# Patient Record
Sex: Female | Born: 1970 | Hispanic: Yes | Marital: Married | Smoking: Never smoker
Health system: Southern US, Community
[De-identification: ages and names within clinical notes are randomized; demographics above are authoritative.]

## PROBLEM LIST (undated history)

## (undated) HISTORY — PX: TUBAL LIGATION: SHX77

## (undated) HISTORY — PX: BREAST CYST ASPIRATION: SHX578

---

## 2008-07-20 ENCOUNTER — Ambulatory Visit: Payer: Self-pay

## 2012-02-11 ENCOUNTER — Emergency Department: Payer: Self-pay | Admitting: Emergency Medicine

## 2012-02-11 LAB — BASIC METABOLIC PANEL
BUN: 13 mg/dL (ref 7–18)
Chloride: 106 mmol/L (ref 98–107)
Creatinine: 0.44 mg/dL — ABNORMAL LOW (ref 0.60–1.30)
EGFR (Non-African Amer.): >60
Potassium: 3.7 mmol/L (ref 3.5–5.1)

## 2012-04-21 ENCOUNTER — Encounter: Payer: Self-pay | Admitting: Orthopedic Surgery

## 2012-05-16 ENCOUNTER — Encounter: Payer: Self-pay | Admitting: Orthopedic Surgery

## 2012-10-21 ENCOUNTER — Ambulatory Visit: Payer: Self-pay

## 2012-10-27 ENCOUNTER — Ambulatory Visit: Payer: Self-pay | Admitting: General Surgery

## 2012-11-17 ENCOUNTER — Ambulatory Visit (INDEPENDENT_AMBULATORY_CARE_PROVIDER_SITE_OTHER): Payer: PRIVATE HEALTH INSURANCE | Admitting: General Surgery

## 2012-11-17 ENCOUNTER — Encounter: Payer: Self-pay | Admitting: General Surgery

## 2012-11-17 VITALS — BP 110/70 | HR 70 | Resp 12 | Ht 61.5 in | Wt 176.0 lb

## 2012-11-17 DIAGNOSIS — D1720 Benign lipomatous neoplasm of skin and subcutaneous tissue of unspecified limb: Secondary | ICD-10-CM

## 2012-11-17 DIAGNOSIS — D2360 Other benign neoplasm of skin of unspecified upper limb, including shoulder: Secondary | ICD-10-CM

## 2012-11-17 NOTE — Progress Notes (Signed)
Patient ID: Holly Paul, female   DOB: Nov 24, 1970, 42 y.o.   MRN: [id number]  Chief Complaint  Patient presents with  . Other    right breast mass    HPI Holly Paul is a 42 y.o. female who presents for a breast evaluation. The most recent mammogram and ultrasound was done on 10-21-12.  Patient does not perform regular self breast checks and does not get regular mammograms done. Her previous mammogram was 5 years ago. States she found a knot about one year ago in right axillary area. Denies change in size. Tender to touch. Denies any redness or drainage from the area.   HPI  History reviewed. No pertinent past medical history.  Past Surgical History  Procedure Laterality Date  . Tubal ligation      Family History  Problem Relation Age of Onset  . Diabetes Sister     Social History History  Substance Use Topics  . Smoking status: Never Smoker   . Smokeless tobacco: Never Used  . Alcohol Use: No    No Known Allergies  No current outpatient prescriptions on file.   No current facility-administered medications for this visit.    Review of Systems Review of Systems  Constitutional: Negative.   Respiratory: Negative.   Cardiovascular: Negative.     Blood pressure 110/70, pulse 70, resp. rate 12, height 5' 1.5" (1.562 m), weight 176 lb (79.833 kg), last menstrual period 10/25/2012.  Physical Exam Physical Exam  Constitutional: She is oriented to person, place, and time. She appears well-developed and well-nourished.  Eyes: Conjunctivae are normal. No scleral icterus.  Neck: Neck supple.  Cardiovascular: Normal rate and regular rhythm.   Pulmonary/Chest: Effort normal and breath sounds normal. Right breast exhibits no inverted nipple, no mass, no nipple discharge, no skin change and no tenderness. Left breast exhibits no inverted nipple, no mass, no nipple discharge, no skin change and no tenderness.  Lymphadenopathy:    She has no cervical  adenopathy.    She has no axillary adenopathy.  Right axillary is more prominent and suggestive of fullness in this area  Neurological: She is alert and oriented to person, place, and time.  Skin: Skin is warm and dry.    Data Reviewed Mammogram and ultrasound reviewed. Showed a couple of 3 mm cysts--11 ocl and in ax tail.   Assessment    Exam shows fullness to right axillary area possible small lipoma about 2 cm in size. No lymphadenopathies noted.     Plan    Continue to monitor area of concern. No need for excision at this time.  Interpreter present throughout.       SANKAR,SEEPLAPUTHUR G 11/17/2012, 11:17 AM

## 2012-11-17 NOTE — Patient Instructions (Addendum)
Continue to monitor area of concern. No need for excision at this time. If it gets larger or is more painful then we can reevaluate the area. Autoexamen de [employer]  Chief Executive Officer) El autoexamen de mamas puede detectar problemas de manera temprana, prevenir complicaciones mdicas significativas y posiblemente salvar su vida. Al hacerlo, podr familiarizarse con el aspecto y forma de sus [place], y observar cambios. Esto le permite descubrir cambios de manera precoz. Este autoexamen [employer] ofrece la tranquilidad de que sus senos estn en buen [place]. Una forma de aprender qu es normal para sus mamas y si sufren modificaciones es Radio producer un autoexamen.   Si encuentra un bulto o algo que no estaba presente anteriormente, lo mejor es ponerse en contacto con su mdico inmediatamente. Otro hallazgo que debe ser evaluado por su mdico es la secrecin del pezn, especialmente si es con sangre; cambios en la piel o enrojecimiento; reas donde la piel parece estar tironeada (retrada) o nuevos bultos o protuberancias. El dolor en los senos es rara vez se asocia con el cncer (malignidad), pero tambin debe ser evaluado por un mdico.  CMO REALIZAR EL AUTOEXAMEN DE MAMAS  El mejor momento para examinar sus mamas es a los 5 a 7 das despus de finalizado el perodo menstrual. Durante la menstruacin, las mamas estn ms abultadas y puede haber ms dificultad para Clinical research associate modificaciones. Si no menstra, ha llegado a la menopausia, o le han extirpado el tero (histerectomia), usted debe examinar sus senos a intervalos regulares, por ejemplo cada mes. Si est amamantando, examine sus senos despus de alimentar al beb o despus de usar un extractor de [place]. Los implantes mamarios no disminuyen el riesgo de bultos o tumores, por lo que debe seguir realizando el autoexamen de [employer] se recomienda. Hable con su mdico acerca de cmo determinar la diferencia entre el implante y el tejido [place] Autry. Adems,  debe consultar cuanta presin debe hacer durante el examen. Con el tiempo se familiarizar con las variaciones de las mamas y se sentir ms cmoda para Horticulturist, commercial. Para el autoexamen deber quitarse toda la ropa de la cintura para [place].  1.  Observe sus senos y pezones. Prese frente a un espejo en una habitacin con buena iluminacin. Con las [employer] caderas, presione las manos firmemente [place]. Busque diferencias en la forma, el contorno y el tamao de un pecho al otro (asimetras). Entre las asimetras se incluyen arrugas, depresiones o protuberancias. Tambin, busque cambios en la piel, como reas enrojecidas o escamosas. Busque cambios en los pezones, como secreciones, hoyuelos, cambios en la posicin, o enrojecimiento. 2. Palpe cuidadosamente sus senos. Es mucho mejor [employer] en la ducha o en la baera, [place] jabn o cuando est recostada sobre su espalda. Coloque el brazo (en el lado de la mama que se examina) por arriba de la cabeza. Use las yemas (no las puntas) de los tres dedos centrales de la mano opuesta para palpar. Comience en la zona de la axila, haga crculos de  de pulgada (2 cm) y vaya superponindolos. Utilice 3 niveles diferentes de presin (ligero, medio y [place]) en cada crculo antes de pasar al siguiente. Se necesita una presin ligera para sentir los tejidos ms cercanos a la piel. La presin media ayudar a sentir el tejido [employer] un poco ms profundo, mientras que se necesita una presin firme para palpar el tejido que se encuentra cerca de las [place]. Continuar superponiendo crculos y vaya hacia abajo, hasta sentir las  costillas, por debajo del pecho. Luego mueva un espacio del ancho de un dedo hacia el centro del cuerpo. Siga con los crculos del  de pulgada (2 cm) mientras va lentamente hacia la clavcula, cerca de la base del cuello. Contine con el examen hacia arriba y hacia abajo con las 3 intensidades de presin Civil Service fast streamer a la mitad del  pecho. Hgalo con cada seno cuidadosamente, buscando bultos o modificaciones. 3. Debe llevar un registro escrito con los cambios o los hallazgos normales que encuentre para cada seno. Si registra esta informacin, no tiene que depender slo de la memoria para Designer, industrial/product, la sensibilidad o la ubicacin de los [place]. [place] en qu momento se encuentra del ciclo menstrual, si usted todava est menstruando. El tejido [employer] puede tener algunos bultos o tejidos engrosados. Sin embargo, consulte a su mdico si usted Animal nutritionist.   SOLICITE ATENCIN MDICA SI:   Observa cambios en la forma, en el contorno o el tamao de las mamas o los pezones.   Hay modificaciones en la piel, como zonas enrojecidas o escamosas en las mamas o en los pezones.   Tiene una secrecin anormal en los pezones.   Siente un nuevo bulto o reas engrosadas de [place] anormal.  Document Released: 03/04/2005 Document Revised: 02/19/2012 [facility] Patient Information 2014 [place], [place].

## 2014-01-17 ENCOUNTER — Encounter: Payer: Self-pay | Admitting: General Surgery

## 2015-04-01 IMAGING — MG MM DIGITAL DIAGNOSTIC BILAT W/ CAD
1 series · 8 of 8 positions shown · non-contrast
Comparison: none

REASON FOR EXAM: [REDACTED] RT BR MASS AXILLA
COMMENTS:

[R CC · right · 8 of 10 slices shown]
[im 1/10]
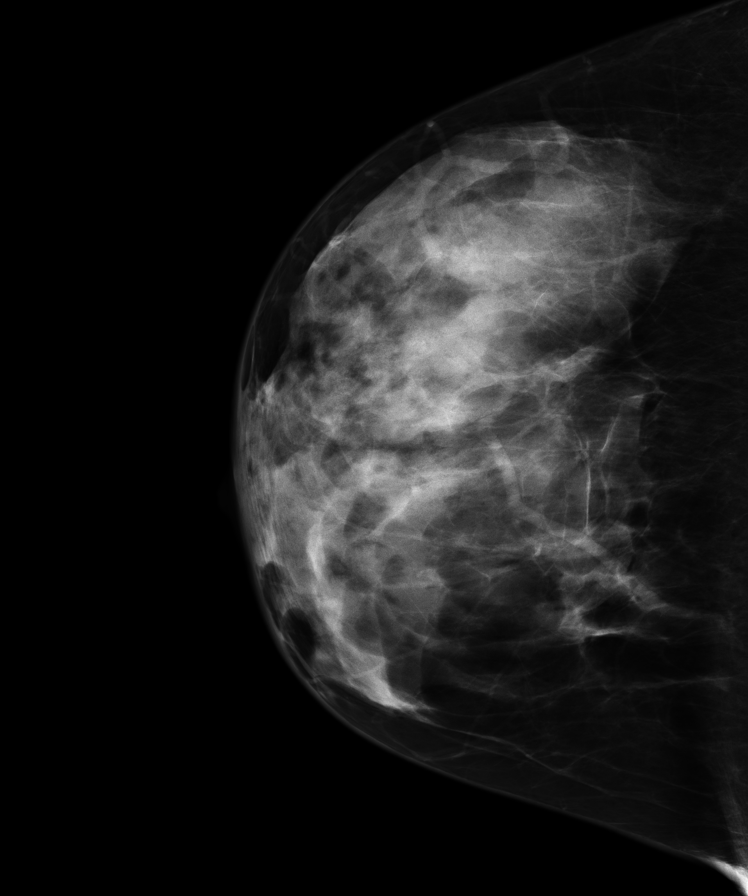
[im 2/10]
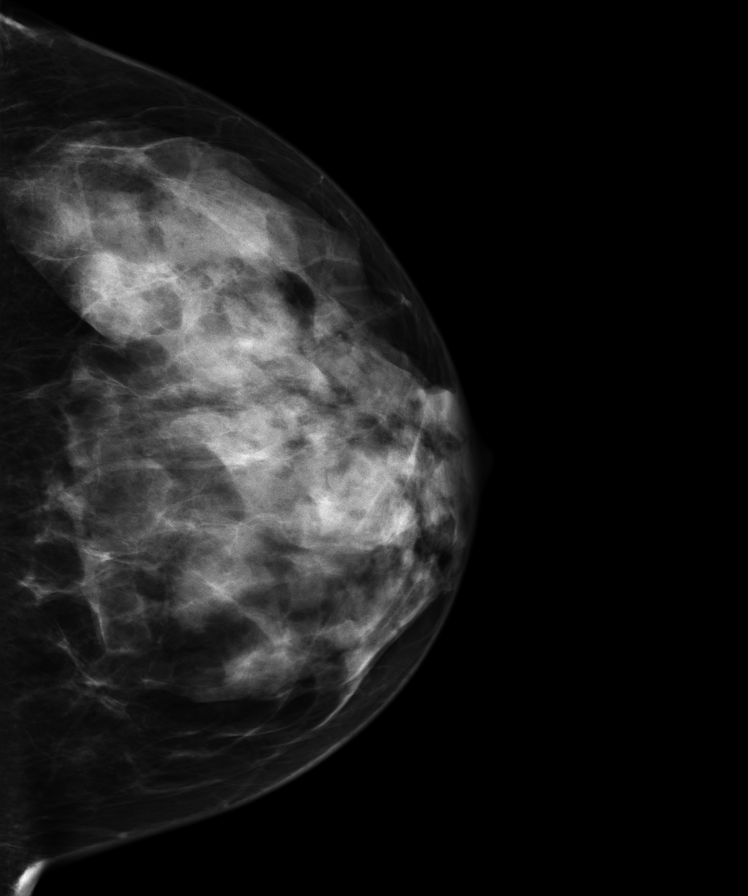
[im 3/10]
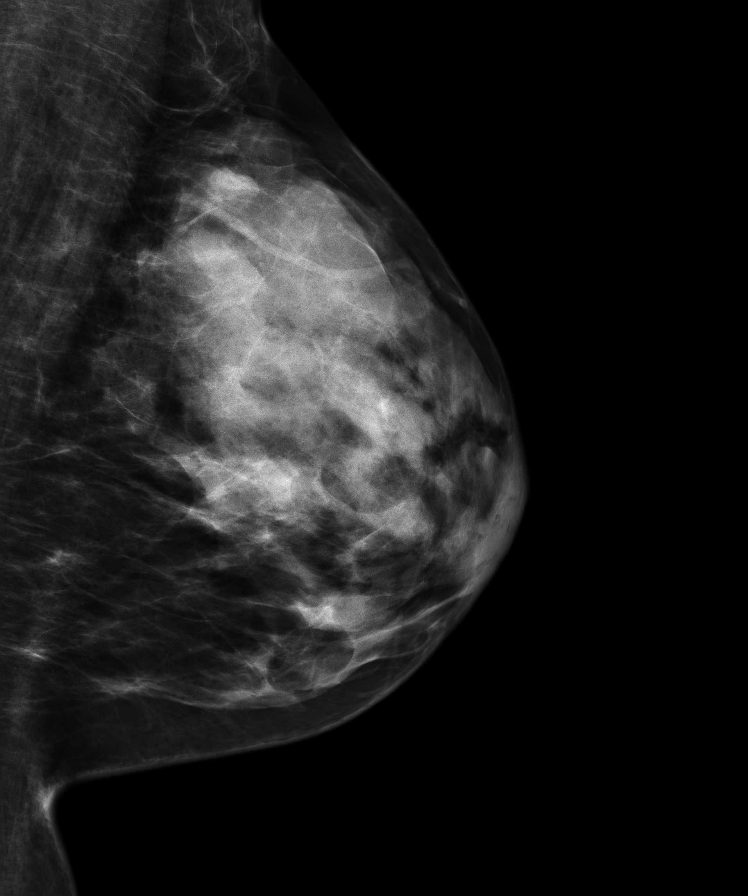
[im 4/10]
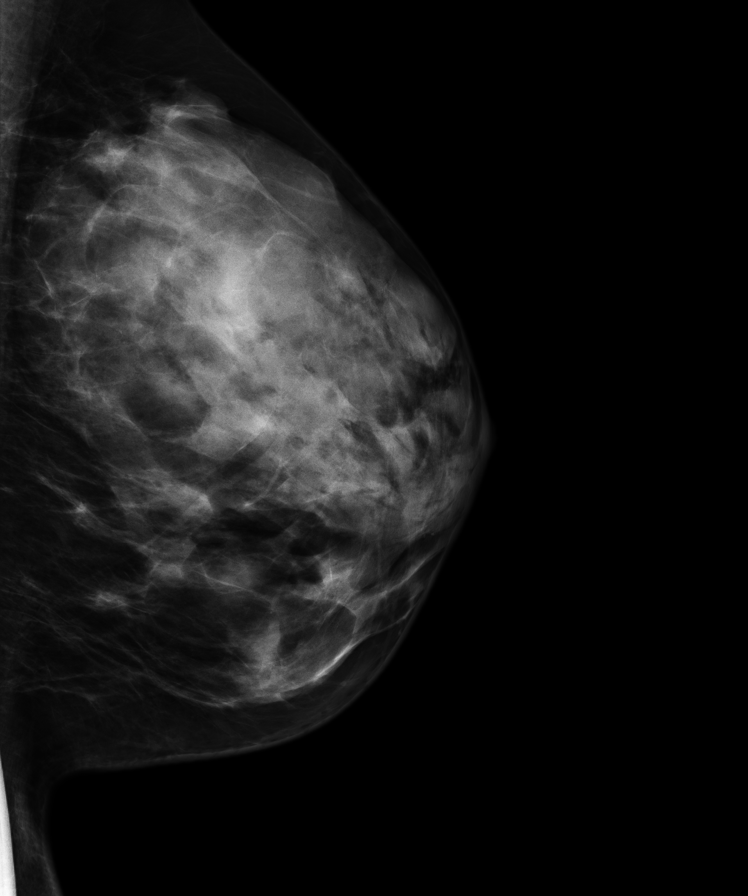
[im 6/10]
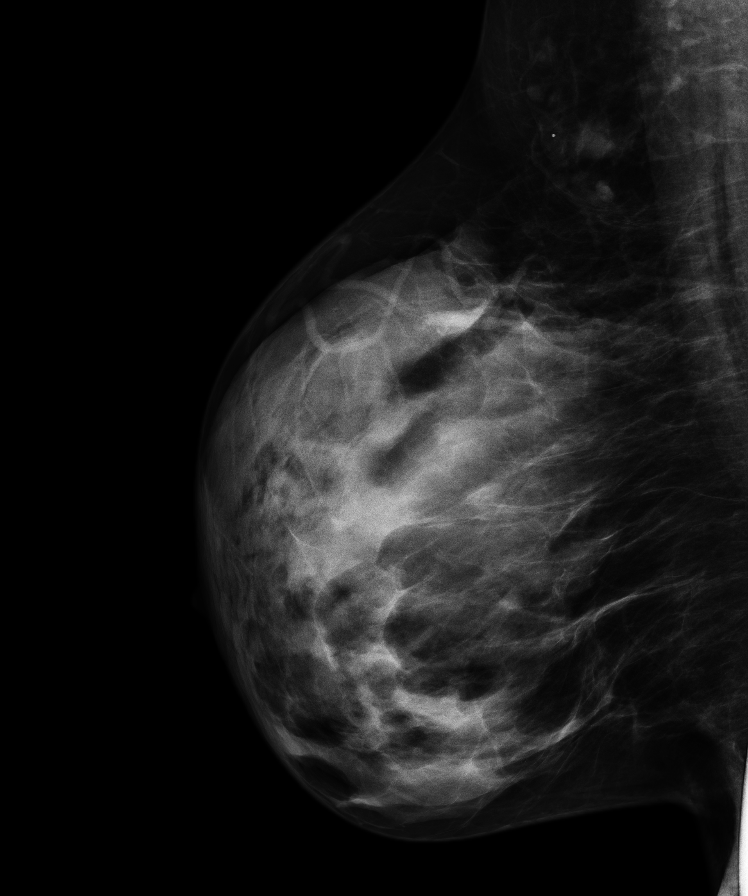
[im 7/10]
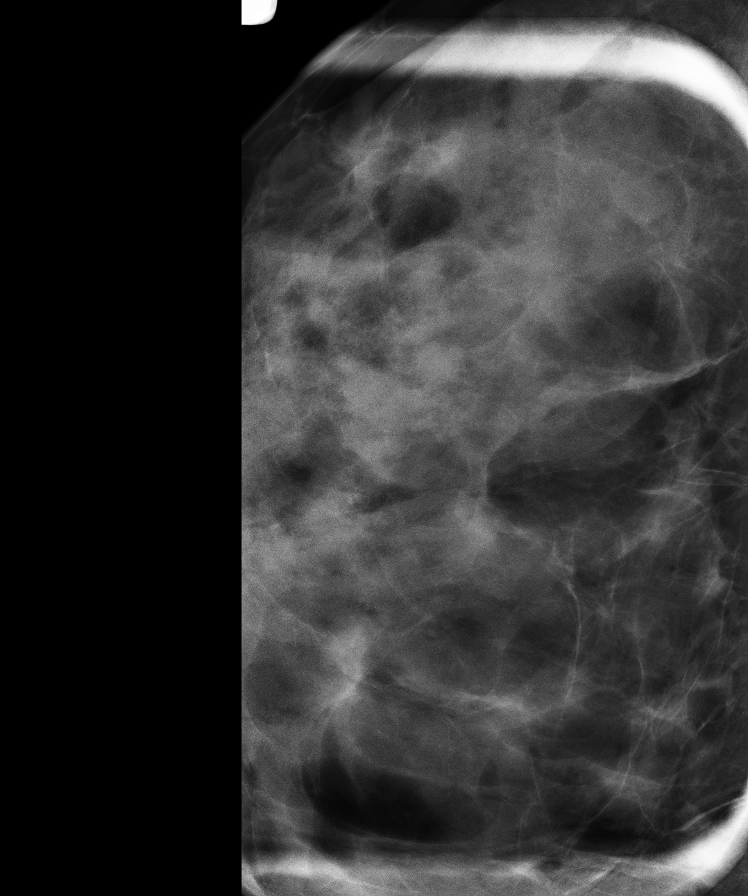
[im 8/10]
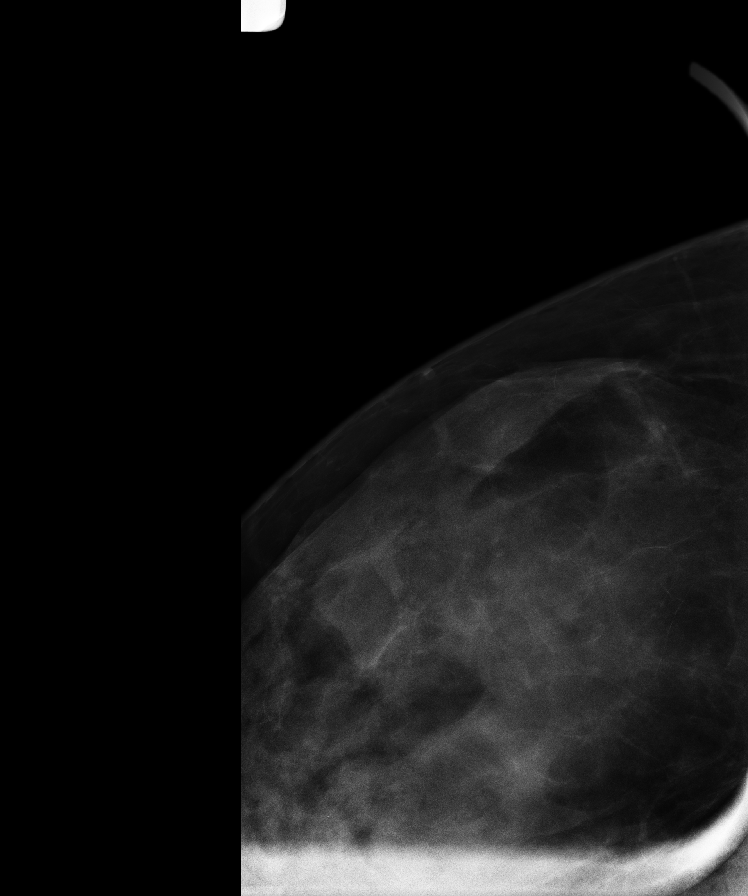
[im 10/10]
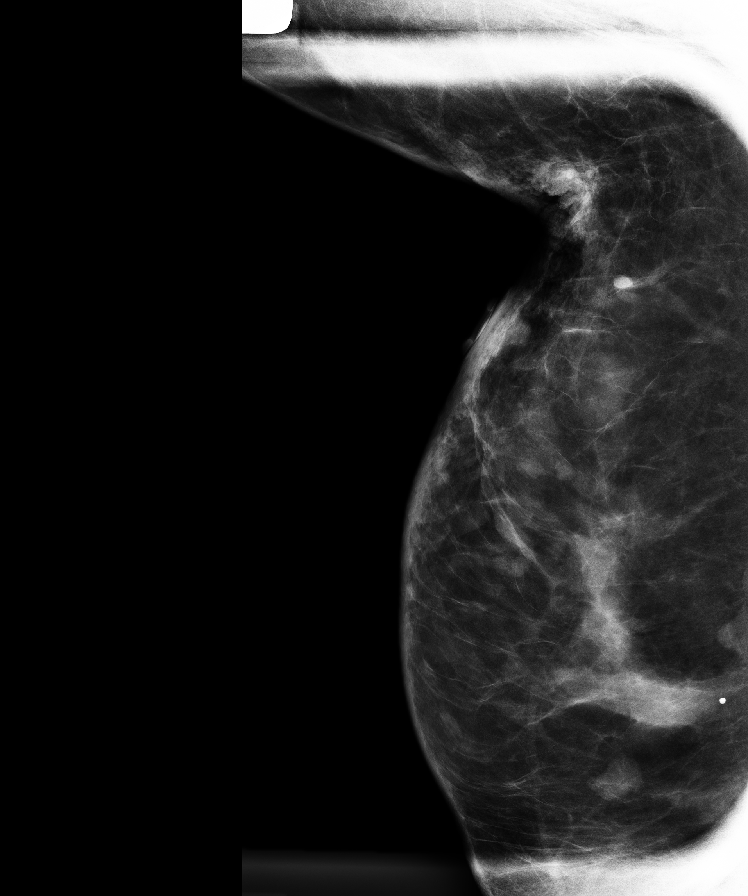

[8 of 8 positions shown; findings below may reference images not displayed]

PROCEDURE:     MAM - MAM [REDACTED] DIG DIAGNOSTIC W/CAD  - October 21, 2012 [DATE]

RESULT:     Comparison made to a previous digital study dated July 20, 2008.

The breasts exhibit a heterogeneously dense parenchymal pattern. There is no
dominant mass. There are no malignant appearing groupings of
microcalcification. There is density in the right axillary region that
likely reflects a lymph node. There are no malignant appearing groupings of
microcalcification and there are no areas of new architectural distortion.

Ultrasound performed today of the upper outer aspect of the breast. At the
[DATE] position 4 cm from the nipple there is a 3 by 2 x 2 millimeter
diameter cystic appearing structure. In the axillary region region a cyst is
demonstrated deep to the area marked measuring 3 x 2 x 2 mm.
IMPRESSION: The breasts exhibit a dense fibroglandular pattern. I do not see findings
suspicious for malignancy. There are tiny cysts present in the upper outer
aspect of the right breast and axillary region.

BI-RADS 2: Benign findings.

Recommendation: Continued clinical followup is recommended. Continued annual
mammographic followup is recommended as well.

BREAST COMPOSITION: The breast composition is HETEROGENEOUSLY DENSE
(glandular tissue is 51-75%) This may decrease the sensitivity of
mammography.

A NEGATIVE MAMMOGRAM REPORT DOES NOT PRECLUDE BIOPSY OR OTHER EVALUATION OF
A CLINICALLY PALPABLE OR OTHERWISE SUSPICIOUS MASS OR LESION. BREAST CANCER
MAY NOT BE DETECTED BY MAMMOGRAPHY IN UP TO 10% OF CASES.

Dictation site:1

## 2015-04-01 IMAGING — US ULTRASOUND RIGHT BREAST
1 series · 14 of 25 positions shown · non-contrast
Comparison: none

REASON FOR EXAM: [REDACTED] RT BR MASS AXILLA
COMMENTS:

[Series 1: ultrasound right breast · 0.08mm/px · 14 of 31 slices shown]
[im 1/31]
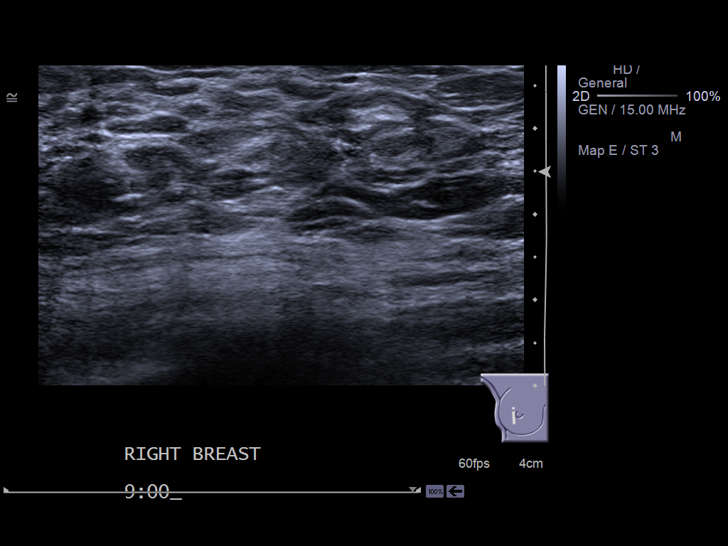
[im 3/31]
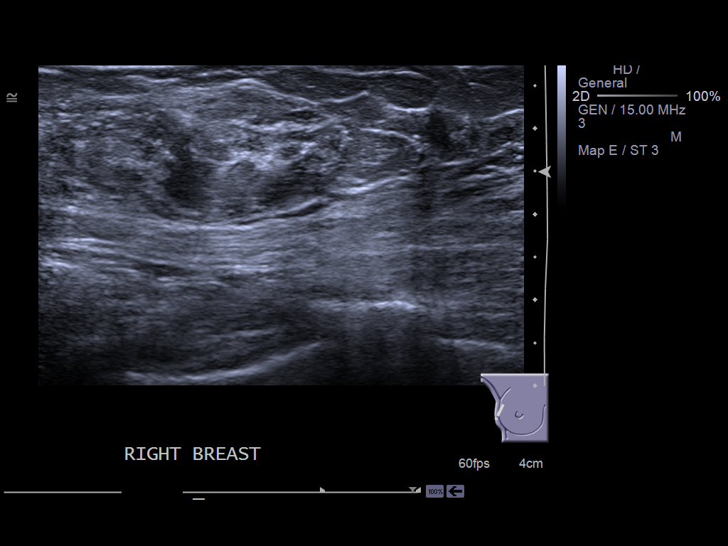
[im 6/31]
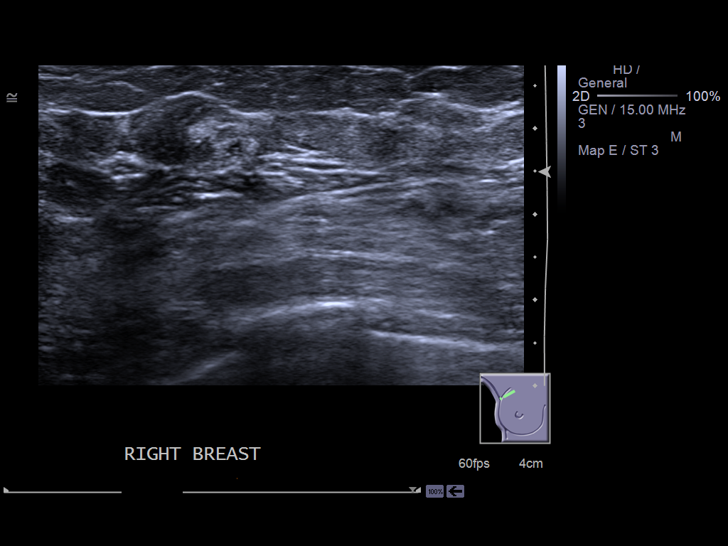
[im 8/31]
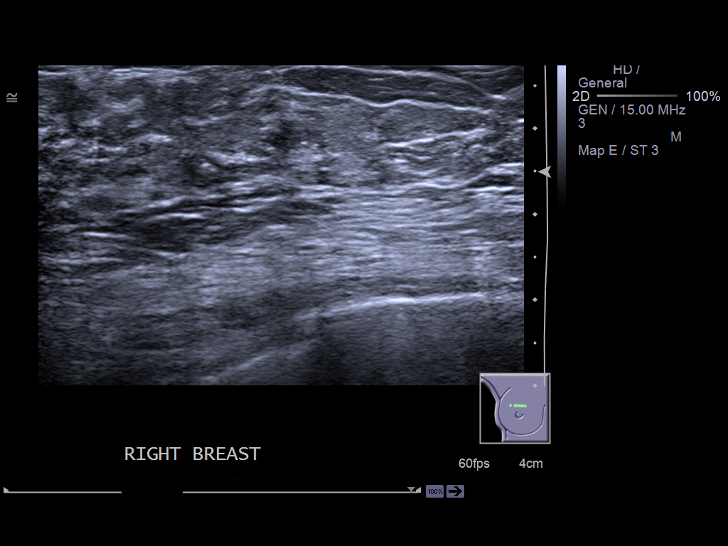
[im 11/31]
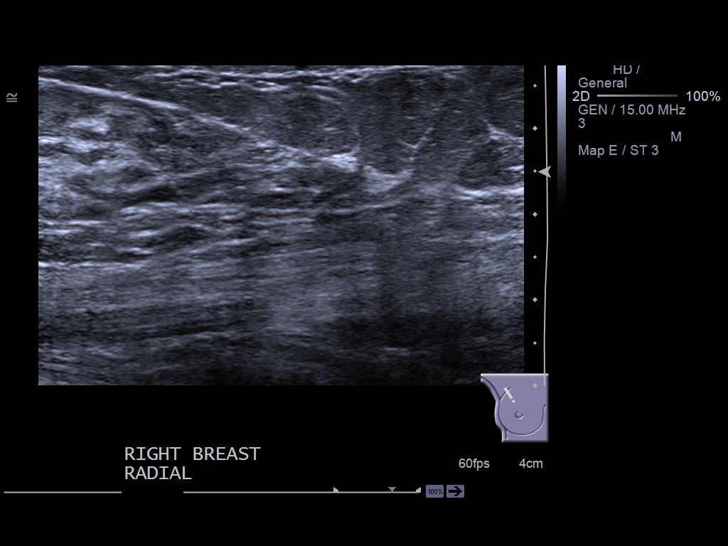
[im 12/31]
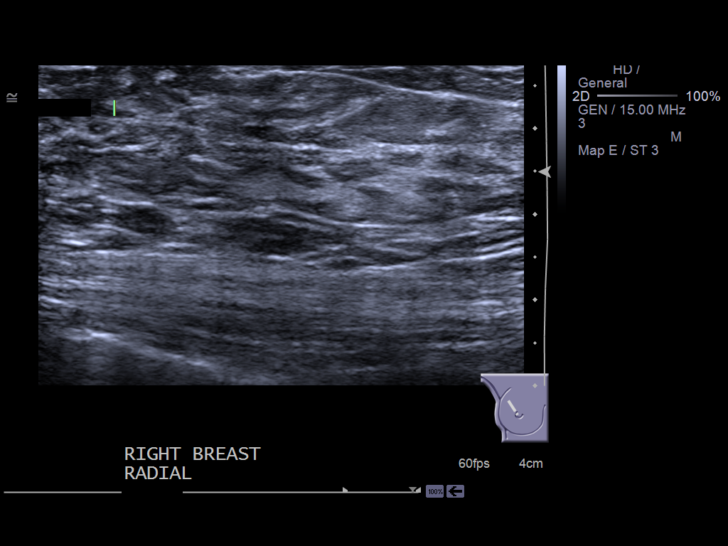
[im 14/31]
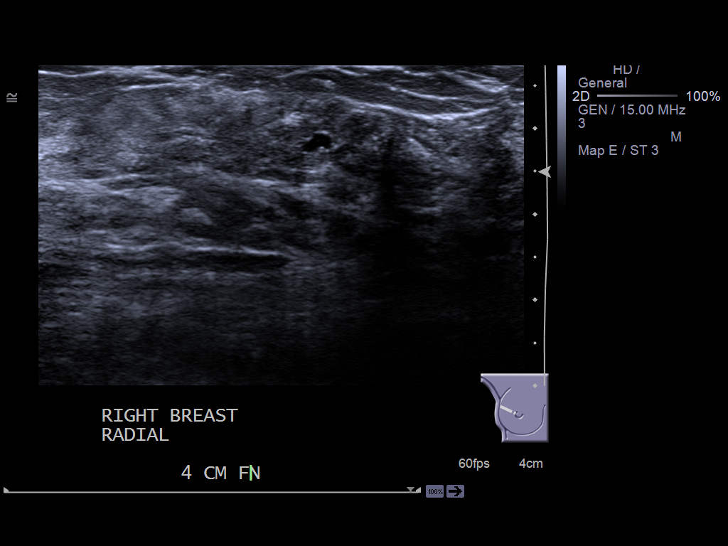
[im 17/31]
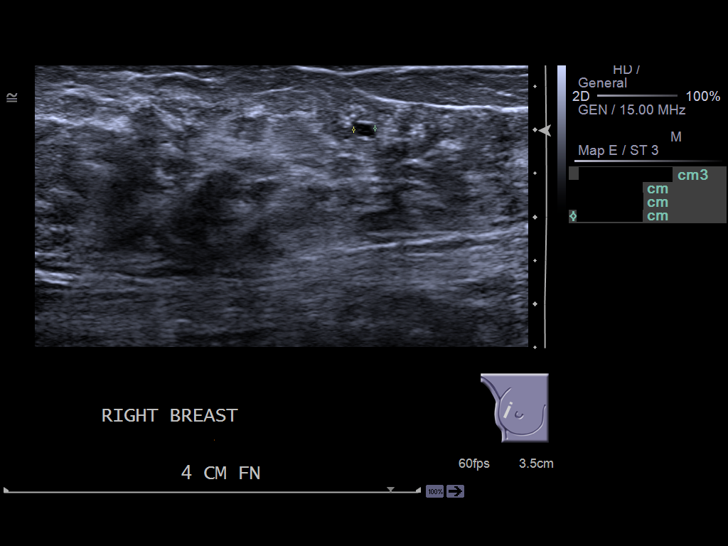
[im 19/31]
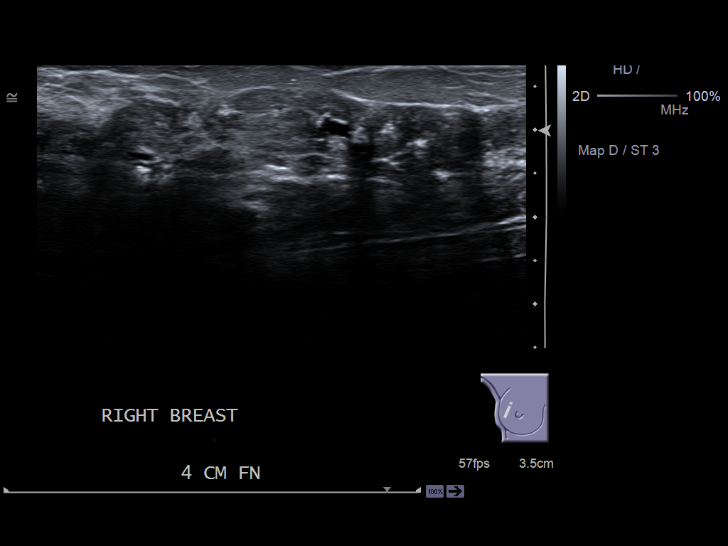
[im 21/31]
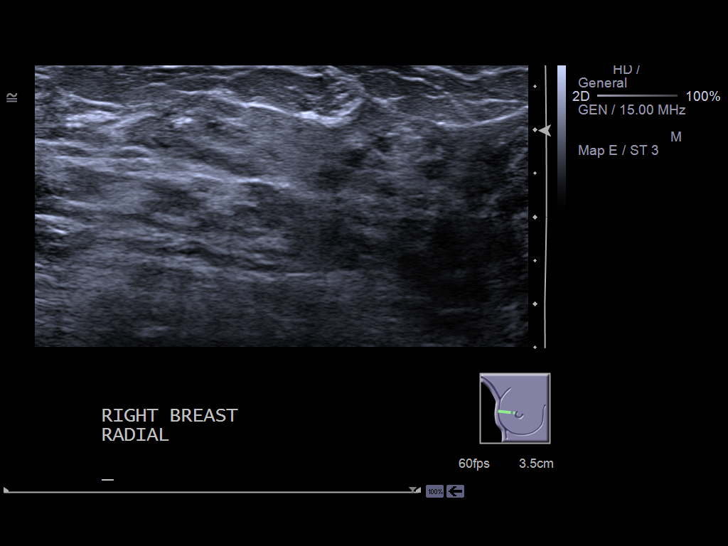
[im 23/31]
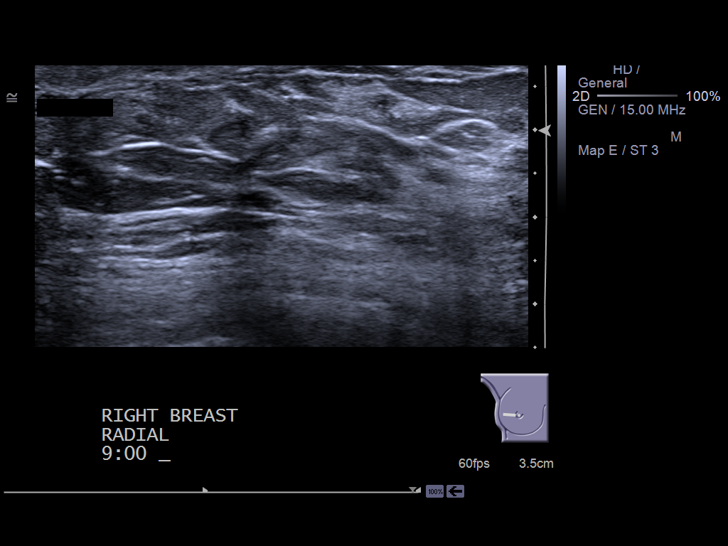
[im 26/31]
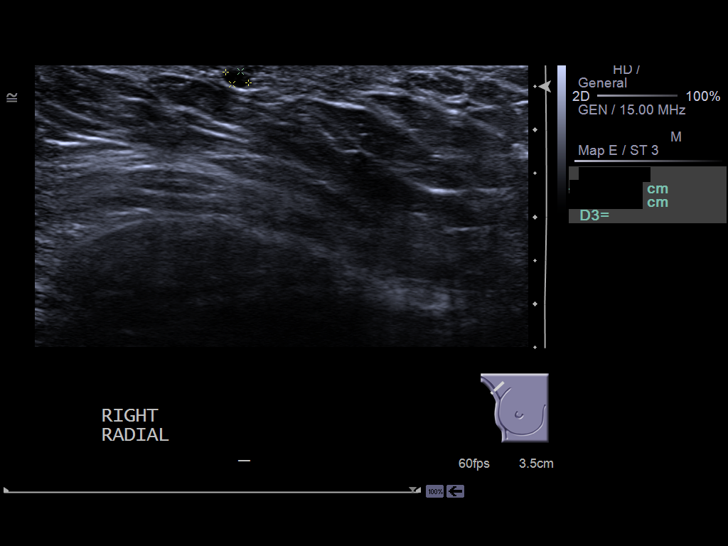
[im 28/31]
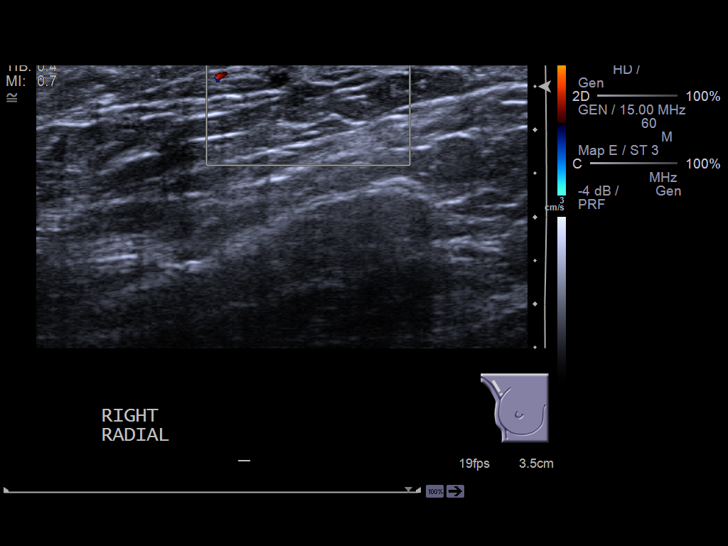
[im 31/31]
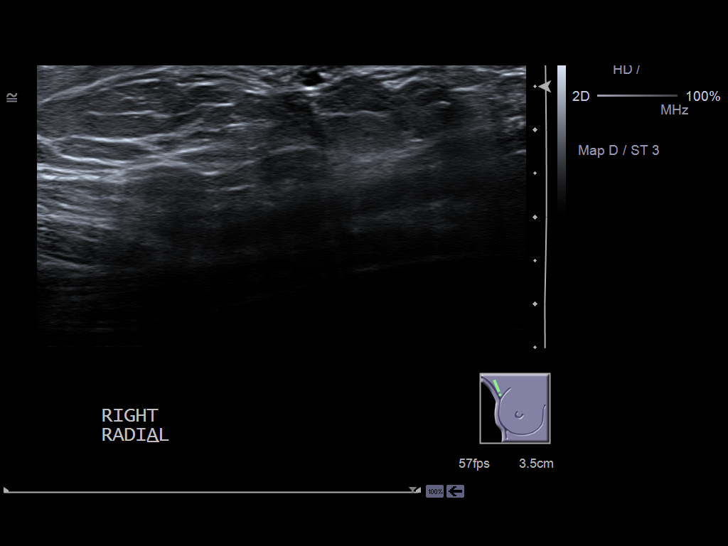

[14 of 25 positions shown; findings below may reference images not displayed]

PROCEDURE:     US  - US RT BREAST ([REDACTED])  - October 21, 2012 [DATE]

RESULT:     Ultrasound directed to the upper outer aspect of the right
breast was performed.

Approximately 4 cm from the nipple at the [DATE] position there is a 3 x 2 x
2 millimeter diameter cystic structure. More laterally in the axillary
region at approximately the [DATE] to [DATE] position there is a superficial
appearing cystic structure measuring 3 x 2 x 2 cm. The echotexture of the
imaged parenchyma is otherwise normal. No suspicious shadowing masses are
demonstrated.
IMPRESSION: Two tiny subcentimeter cysts are demonstrated as described.
Please see the dictation of the diagnostic mammogram of the right breast of
today's date for final recommendations and BI-RADS classification.

Dictation site:1

## 2015-06-12 ENCOUNTER — Ambulatory Visit
Admission: RE | Admit: 2015-06-12 | Discharge: 2015-06-12 | Disposition: A | Discharge status: Home / Self Care | Payer: Self-pay | Source: Ambulatory Visit | Attending: Oncology | Admitting: Oncology

## 2015-06-12 ENCOUNTER — Ambulatory Visit: Payer: Self-pay | Attending: Oncology | Admitting: *Deleted

## 2015-06-12 ENCOUNTER — Encounter: Payer: Self-pay | Admitting: *Deleted

## 2015-06-12 VITALS — BP 102/71 | HR 64 | Temp 98.5°F | Ht 61.81 in | Wt 199.0 lb

## 2015-06-12 DIAGNOSIS — N63 Unspecified lump in unspecified breast: Secondary | ICD-10-CM

## 2015-06-12 NOTE — Progress Notes (Addendum)
Subjective:     Patient ID: Holly Paul, female   DOB: [birth year], 45 y.o.   MRN: [id number]  HPI   Review of Systems     Objective:   Physical Exam  Pulmonary/Chest: Right breast exhibits mass. Right breast exhibits no inverted nipple, no nipple discharge, no skin change and no tenderness. Left breast exhibits mass. Left breast exhibits no inverted nipple, no nipple discharge, no skin change and no tenderness. Breasts are asymmetrical.    Right breast 2 cup sizes larger than the left       Assessment:  45 year old Hispanic female referred back to [facility] by Dr. Rutherford Guys at the [facility] for a 3 cm mass in the upper outer quadrant of the left breast.  Montey Hora, the interpreter present during the interview and exam.  In review of mammograms, the last mammogram at [facility] in September 2015 was a diagnostic mammogram for a palpable 3 cm mass in the upper outer quadrant of the left breast.  Tried to discuss if this area of concern that Dr. Rebeca Alert referred her for today, is the same area that Dr. Ellene Route referred her for in 2015.  Patient is a very poor historian, and it was difficult to determine if the area of concern is the same as in 2015.  The patient also had work up of the right breast in 2014.  On clinical breast exam I can palpate multiple nodules in bilateral breast.  There is: a 1 cm mass 9:00 right breast, 3 cm mass 2-3:00 left breast, 2 cm mass 11-12:00 left breast, and a 1 cm mass at 6:00 left breast.  Taught self breast awareness. Patient has been screened for eligibility.  She does not have any insurance, Medicare or Medicaid.  She also meets financial eligibility.  Hand-out given on the Affordable Care Act.    Plan:     Will get bilateral diagnostic mammogram with ultrasound.  Since Dr. Jamal Collin has seen the patient in the past, I feel it would be prudent for him to see the patient again.  He can pull all the info from previous mammograms and current mammogram, and  make the appropriate recommendations in the patients best interest.  Will follow-up per [facility] protocol and Dr. Jamal Collin.

## 2015-06-12 NOTE — Patient Instructions (Signed)
Gave patient breast cancer educational literature, "My Breast Cancer Treatment Handbook" by Josephine Igo, RN.

## 2015-06-13 ENCOUNTER — Ambulatory Visit
Admission: RE | Admit: 2015-06-13 | Discharge: 2015-06-13 | Disposition: A | Discharge status: Home / Self Care | Payer: Self-pay | Source: Ambulatory Visit | Attending: Oncology | Admitting: Oncology

## 2015-06-13 ENCOUNTER — Ambulatory Visit: Payer: Self-pay

## 2015-06-21 ENCOUNTER — Ambulatory Visit: Payer: Self-pay

## 2015-06-21 ENCOUNTER — Other Ambulatory Visit: Payer: Self-pay

## 2015-06-21 ENCOUNTER — Ambulatory Visit: Payer: PRIVATE HEALTH INSURANCE | Admitting: General Surgery

## 2015-07-04 ENCOUNTER — Encounter: Payer: Self-pay | Admitting: General Surgery

## 2015-07-04 ENCOUNTER — Other Ambulatory Visit: Payer: Self-pay

## 2015-07-04 ENCOUNTER — Ambulatory Visit (INDEPENDENT_AMBULATORY_CARE_PROVIDER_SITE_OTHER): Payer: PRIVATE HEALTH INSURANCE | Admitting: General Surgery

## 2015-07-04 VITALS — BP 118/68 | HR 78 | Resp 14 | Ht 61.8 in | Wt 202.0 lb

## 2015-07-04 DIAGNOSIS — N6002 Solitary cyst of left breast: Secondary | ICD-10-CM

## 2015-07-04 NOTE — Progress Notes (Signed)
Patient ID: Holly Paul, female   DOB: 02/28/1971, 45 y.o.   MRN: [id number]  Chief Complaint  Patient presents with  . Breast Problem    HPI Holly Paul is a 45 y.o. female.  Who presents for a breast evaluation of bilateral breast masses referred by Tanya Nones RN. The most recent mammogram was done on 06-12-15. She had bilateral breast ultrasounds as well. Patient does perform regular self breast checks and gets regular mammograms done.  She states she has been able to feel the mass in the left breast for the past two months but has been unable to feel a mass in the right breast. She notes left breast tenderness upon palpation. The tenderness is on the superior and inferior portions of the left breast. She states the breast mass has not changed in size. She has not been able to associate the pain to her menstrual cycle.  Interpreter, Chip Boer, present for interview, exam and discussion. I have reviewed the history of present illness with the patient.  HPI  History reviewed. No pertinent past medical history.  Past Surgical History  Procedure Laterality Date  . Tubal ligation      Family History  Problem Relation Age of Onset  . Diabetes Sister     Social History Social History  Substance Use Topics  . Smoking status: Never Smoker   . Smokeless tobacco: Never Used  . Alcohol Use: No    No Known Allergies  No current outpatient prescriptions on file.   No current facility-administered medications for this visit.    Review of Systems Review of Systems  Constitutional: Negative.   Respiratory: Negative.   Cardiovascular: Negative.     Blood pressure 118/68, pulse 78, resp. rate 14, height 5' 1.8" (1.57 m), weight 202 lb (91.627 kg), last menstrual period 06/05/2015.  Physical Exam Physical Exam  Constitutional: She is oriented to person, place, and time. She appears well-developed and well-nourished.  HENT:  Mouth/Throat: Oropharynx is clear and  moist.  Eyes: Conjunctivae are normal. No scleral icterus.  Neck: Neck supple.  Cardiovascular: Normal rate and regular rhythm.   Murmur heard.  Systolic murmur is present with a grade of 2/6  Pulmonary/Chest: Effort normal and breath sounds normal. Right breast exhibits no inverted nipple, no mass, no nipple discharge, no skin change and no tenderness. Left breast exhibits mass. Left breast exhibits no inverted nipple, no nipple discharge, no skin change and no tenderness.  Right breast has a focal 2 cm fibrotic thickening at 8-9 o'clock 3-4 CFN. Left breast has a 2 cm firm mass at 11-12 o'clock and a 3-4 cm mass close to the axillary tail.  Lymphadenopathy:    She has no cervical adenopathy.  Neurological: She is alert and oriented to person, place, and time.  Skin: Skin is warm and dry.  Psychiatric: Her behavior is normal.    Data Reviewed Mammogram and ultrasound images reviewed. Right breast ultrasound showed no abnormality. Left breast showed multiple cysts. At least two dominant ones.  Assessment    Multiple left breast cysts likely accounting for her pain.     Plan Because of her pain decided to aspirate the two larger cysts. Patient was agreeable. With ultrasound guidance, the cyst at 11 oclock was aspirated with a 22 gauge needle and 4 mL of turbid yellowish brown fluid was removed. The mass near the axillary tail had a very large cyst. This was aspirated yielding 8 mL of similar fluid. Patient advised  fully of nature of cysts and that they may reoccur or enlarge.   Patient has been informed to call the office if any change in the mass occurs. She is to return in 2 months.    Interpreter was present for the duration of the patient's visit. PCP:  Rutherford Guys Ref: Tanya Nones RN [facility]  This information has been scribed by Karie Fetch RN, BSN,BC.   Aarya Robinson G 07/04/2015, 3:31 PM

## 2015-07-04 NOTE — Patient Instructions (Addendum)
The patient is aware to call back for any questions or concerns. Return in 2 months.

## 2015-07-13 ENCOUNTER — Encounter: Payer: Self-pay | Admitting: *Deleted

## 2015-07-13 NOTE — Progress Notes (Signed)
Patient was seen by Dr. Jamal Collin.  She had multiple benign cyst.  She is to follow-up with him in a couple of months.  [facility] to Jud.

## 2015-08-31 ENCOUNTER — Encounter: Payer: Self-pay | Admitting: *Deleted

## 2015-09-06 ENCOUNTER — Ambulatory Visit: Payer: PRIVATE HEALTH INSURANCE | Admitting: General Surgery

## 2015-09-25 ENCOUNTER — Encounter: Payer: Self-pay | Admitting: General Surgery

## 2015-09-25 ENCOUNTER — Ambulatory Visit (INDEPENDENT_AMBULATORY_CARE_PROVIDER_SITE_OTHER): Payer: PRIVATE HEALTH INSURANCE | Admitting: General Surgery

## 2015-09-25 VITALS — BP 120/70 | HR 80 | Resp 12 | Ht 62.0 in | Wt 205.0 lb

## 2015-09-25 DIAGNOSIS — N6002 Solitary cyst of left breast: Secondary | ICD-10-CM | POA: Diagnosis not present

## 2015-09-25 NOTE — Patient Instructions (Signed)
[place] as needed.

## 2015-09-25 NOTE — Progress Notes (Signed)
Patient ID: Holly Paul, female   DOB: [birth year], 45 y.o.   MRN: [id number]  Chief Complaint  Patient presents with  . Follow-up    left breast cyst    HPI Holly Paul is a 45 y.o. female here today for her three month follow up post aspiration left breast cyst. No complaints. She has no further pain in her breasts. I have reviewed the history of present illness with the patient.  HPI  History reviewed. No pertinent past medical history.  Past Surgical History  Procedure Laterality Date  . Tubal ligation      Family History  Problem Relation Age of Onset  . Diabetes Sister     Social History Social History  Substance Use Topics  . Smoking status: Never Smoker   . Smokeless tobacco: Never Used  . Alcohol Use: No    No Known Allergies  No current outpatient prescriptions on file.   No current facility-administered medications for this visit.    Review of Systems Review of Systems  Constitutional: Negative.   Respiratory: Negative.   Cardiovascular: Negative.     Blood pressure 120/70, pulse 80, resp. rate 12, height 5\' 2"  (1.575 m), weight 205 lb (92.987 kg), last menstrual period 09/05/2015.  Physical Exam Physical Exam  Constitutional: She is oriented to person, place, and time. She appears well-developed and well-nourished.  Pulmonary/Chest: Right breast exhibits no inverted nipple, no mass, no nipple discharge, no skin change and no tenderness. Left breast exhibits no inverted nipple, no mass, no nipple discharge, no skin change and no tenderness. Breasts are symmetrical.  Lymphadenopathy:    She has no cervical adenopathy.    She has no axillary adenopathy.  Neurological: She is alert and oriented to person, place, and time.  Skin: Skin is warm and dry.    Data Reviewed Prior notes reviewed   Assessment      No palpable masses found. FCD.     Plan    Patient to return as needed. She is advised to have yearly mammograms and  MD breast exam    Interpreter present for full duration of this visit PCP:  Rebeca Alert This information has been scribed by Gaspar Cola CMA.    SANKAR,SEEPLAPUTHUR G 09/26/2015, 9:10 AM

## 2015-09-26 ENCOUNTER — Encounter: Payer: Self-pay | Admitting: General Surgery

## 2016-08-07 ENCOUNTER — Ambulatory Visit: Payer: Self-pay

## 2016-09-04 ENCOUNTER — Ambulatory Visit
Admission: RE | Admit: 2016-09-04 | Discharge: 2016-09-04 | Disposition: A | Discharge status: Home / Self Care | Payer: Self-pay | Source: Ambulatory Visit | Attending: Oncology | Admitting: Oncology

## 2016-09-04 ENCOUNTER — Encounter (INDEPENDENT_AMBULATORY_CARE_PROVIDER_SITE_OTHER): Payer: Self-pay

## 2016-09-04 ENCOUNTER — Ambulatory Visit: Payer: Self-pay | Attending: Oncology

## 2016-09-04 VITALS — BP 105/67 | HR 61 | Temp 97.6°F | Ht 61.5 in | Wt 190.4 lb

## 2016-09-04 DIAGNOSIS — Z0000 Encounter for general adult medical examination without abnormal findings: Secondary | ICD-10-CM

## 2016-09-04 NOTE — Progress Notes (Signed)
Subjective:     Patient ID: Holly Paul, female   DOB: 1970-12-26, 46 y.o.   MRN: [id number]  HPI   Review of Systems     Objective:   Physical Exam  Pulmonary/Chest: Right breast exhibits no inverted nipple, no mass, no nipple discharge, no skin change and no tenderness. Left breast exhibits no inverted nipple, no mass, no nipple discharge, no skin change and no tenderness. Breasts are symmetrical.       Assessment:     46 year old hispanic patient presents for B[facility]CP clinic visit.  Patient screened, and meets B[facility]CP eligibility.  Patient does not have insurance, Medicare or Medicaid.  Handout given on Affordable Care Act.  Instructed patient on breast self-exam using teach back method.  CBE unremarkable.  No mass or lump palpated.  Patient states she does not have follow-up with Dr. Jamal Collin scheduled.  Adin Hector interpreted exam.    Plan:     Sent for bilateral screening mammogram.

## 2016-09-15 NOTE — Progress Notes (Signed)
Letter mailed from [facility] to notify of normal mammogram results.  Patient to return in one year for annual screening.  Copy to HSIS. 

## 2017-12-01 ENCOUNTER — Encounter (INDEPENDENT_AMBULATORY_CARE_PROVIDER_SITE_OTHER): Payer: Self-pay

## 2017-12-01 ENCOUNTER — Ambulatory Visit: Payer: Self-pay | Attending: Oncology

## 2017-12-01 ENCOUNTER — Other Ambulatory Visit: Payer: Self-pay

## 2017-12-01 VITALS — BP 120/79 | HR 62 | Temp 98.2°F | Ht 62.0 in | Wt 211.0 lb

## 2017-12-01 DIAGNOSIS — N63 Unspecified lump in unspecified breast: Secondary | ICD-10-CM

## 2017-12-01 NOTE — Progress Notes (Signed)
  Subjective:     Patient ID: Holly Paul, female   DOB: [birth year], 47 y.o.   MRN: [id number]  HPI   Review of Systems     Objective:   Physical Exam  Pulmonary/Chest: Right breast exhibits mass. Right breast exhibits no inverted nipple, no nipple discharge, no skin change and no tenderness. Left breast exhibits no inverted nipple, no mass, no nipple discharge, no skin change and no tenderness. Breasts are symmetrical.  1 cm. Firm mobile breast lump 11 o'clock right breast         Assessment:     47 year old hispanic patient presents for annual [facility] clinic visit. Holly Paul interpreted exam.   Patient screened, and meets B[facility]CP eligibility.  Patient does not have insurance, Medicare or Medicaid.  Handout given on Affordable Care Act. Instructed patient on breast self awareness using teach back method. Palpated 1 cm. Firm mobile breast lump 11 o'clock right breast .  Otherwise normal exam.     Plan:     Scheduled for bilateral diagnostic mammogram, and ultrasound on 12/08/17.

## 2017-12-08 ENCOUNTER — Ambulatory Visit
Admission: RE | Admit: 2017-12-08 | Discharge: 2017-12-08 | Disposition: A | Discharge status: Home / Self Care | Payer: Self-pay | Source: Ambulatory Visit | Attending: Oncology | Admitting: Oncology

## 2017-12-08 DIAGNOSIS — N63 Unspecified lump in unspecified breast: Secondary | ICD-10-CM

## 2017-12-09 NOTE — Progress Notes (Signed)
Letter mailed from [facility] to notify of normal mammogram results.  Patient to return in one year for annual screening.  Copy to HSIS. 

## 2019-06-22 ENCOUNTER — Ambulatory Visit: Payer: Self-pay | Attending: Oncology

## 2019-06-22 ENCOUNTER — Other Ambulatory Visit: Payer: Self-pay

## 2019-06-22 VITALS — BP 108/65 | HR 64 | Temp 97.8°F | Ht 62.0 in | Wt 228.0 lb

## 2019-06-22 DIAGNOSIS — N63 Unspecified lump in unspecified breast: Secondary | ICD-10-CM

## 2019-06-22 NOTE — Progress Notes (Signed)
  Subjective:     Patient ID: Holly Paul, female   DOB: Apr 06, 1970, 49 y.o.   MRN: [id number]  HPI   Review of Systems     Objective:   Physical Exam Chest:     Breasts:        Right: No swelling, bleeding, inverted nipple, mass, nipple discharge, skin change or tenderness.        Left: Mass present. No swelling, bleeding, inverted nipple, nipple discharge, skin change or tenderness.       Comments: Mobile mass at 11 o'clock and 2 o'clock       Assessment:     49 year old Hispanic patient returns for [facility]CP screening .  Reports she is feeling two new masses in left breast.   She has a history of bilateral breast cysts.  Patient screened, and meets BCCCP eligibility.  Patient does not have insurance, Medicare or Medicaid. Instructed patient on breast self awareness using teach back method    Risk Assessment    Risk Scores      06/22/2019 12/01/2017   Last edited by: Rico Junker, RN Theodore Demark, RN   5-year risk: 0.8 % 0.7 %   Lifetime risk: 8.3 % 8.5 %          Plan:     Scheduled to return on 06/30/19 for diagnostic mammogram and ultrasound.

## 2019-06-30 ENCOUNTER — Ambulatory Visit
Admission: RE | Admit: 2019-06-30 | Discharge: 2019-06-30 | Disposition: A | Discharge status: Home / Self Care | Payer: Self-pay | Source: Ambulatory Visit | Attending: Oncology | Admitting: Oncology

## 2019-06-30 DIAGNOSIS — N63 Unspecified lump in unspecified breast: Secondary | ICD-10-CM

## 2019-07-01 NOTE — Progress Notes (Signed)
Radiologist reviewed Birads 2 findings with patient, and recommendation for annual mammograms.  Copy to HSIS.

## 2020-09-21 ENCOUNTER — Encounter: Payer: Self-pay | Admitting: *Deleted

## 2020-10-03 ENCOUNTER — Ambulatory Visit: Payer: Self-pay

## 2020-10-04 ENCOUNTER — Ambulatory Visit: Payer: Self-pay | Attending: Oncology

## 2020-10-04 ENCOUNTER — Ambulatory Visit
Admission: RE | Admit: 2020-10-04 | Discharge: 2020-10-04 | Disposition: A | Discharge status: Home / Self Care | Payer: Self-pay | Source: Ambulatory Visit | Attending: Oncology | Admitting: Oncology

## 2020-10-04 ENCOUNTER — Other Ambulatory Visit: Payer: Self-pay

## 2020-10-04 VITALS — BP 104/61 | HR 60 | Temp 97.3°F | Ht 62.0 in | Wt 225.9 lb

## 2020-10-04 DIAGNOSIS — Z0000 Encounter for general adult medical examination without abnormal findings: Secondary | ICD-10-CM | POA: Insufficient documentation

## 2020-10-04 NOTE — Progress Notes (Signed)
  Subjective:     Patient ID: Holly Paul, female   DOB: Aug 27, 1970, 50 y.o.   MRN: [id number]  Gynecologic Exam    Review of Systems     Objective:   Physical Exam Chest:  Breasts:    Right: No swelling, bleeding, inverted nipple, mass, nipple discharge, skin change or tenderness.     Left: Mass present. No swelling, bleeding, inverted nipple, nipple discharge, skin change or tenderness.       Comments: Known cyst left breast 11 o'clock      Assessment:     50 year old Hispanic patient presents for [place] clinic visit Patient screened, and meets B[facility]CP eligibility.  Patient does not have insurance, Medicare or Medicaid.  Instructed patient on breast self awareness using teach back method Clinical breast exam unremarkable. Palpated mass at 11:00 where she has a known cyst.   Risk Assessment     Risk Scores       10/04/2020 06/22/2019   Last edited by: Theodore Demark, RN Rico Junker, RN   5-year risk: 0.8 % 0.8 %   Lifetime risk: 8.1 % 8.3 %               Plan:     Sent for bilateral screening mammogram.

## 2020-10-31 NOTE — Progress Notes (Signed)
Letter mailed from [facility] to notify of normal mammogram results.  Patient to return in one year for annual screening.  Copy to HSIS. 

## 2022-06-13 ENCOUNTER — Encounter: Payer: Self-pay | Admitting: Primary Care

## 2022-06-26 ENCOUNTER — Other Ambulatory Visit: Payer: Self-pay

## 2022-06-26 DIAGNOSIS — Z1231 Encounter for screening mammogram for malignant neoplasm of breast: Secondary | ICD-10-CM

## 2022-07-08 ENCOUNTER — Other Ambulatory Visit: Payer: Self-pay

## 2022-07-08 ENCOUNTER — Ambulatory Visit
Admission: RE | Admit: 2022-07-08 | Discharge: 2022-07-08 | Disposition: A | Discharge status: Home / Self Care | Payer: Self-pay | Source: Ambulatory Visit | Attending: Obstetrics and Gynecology | Admitting: Obstetrics and Gynecology

## 2022-07-08 ENCOUNTER — Ambulatory Visit: Payer: Self-pay | Attending: Hematology and Oncology | Admitting: Hematology and Oncology

## 2022-07-08 VITALS — BP 137/76 | Wt 245.6 lb

## 2022-07-08 DIAGNOSIS — Z1231 Encounter for screening mammogram for malignant neoplasm of breast: Secondary | ICD-10-CM

## 2022-07-08 DIAGNOSIS — Z1211 Encounter for screening for malignant neoplasm of colon: Secondary | ICD-10-CM

## 2022-07-08 NOTE — Progress Notes (Signed)
[name] is a 52 y.o. female who presents to [facility] clinic today with no complaints.    Pap Smear: Pap not smear completed today. Last Pap smear was 2024 at [facility] and was normal. Per patient has no history of an abnormal Pap smear. Last Pap smear result is not available in Epic.   Physical exam: Breasts Breasts symmetrical. No skin abnormalities bilateral breasts. No nipple retraction bilateral breasts. No nipple discharge bilateral breasts. No lymphadenopathy. No lumps palpated bilateral breasts.    MS DIGITAL SCREENING TOMO BILATERAL  Result Date: 10/05/2020 CLINICAL DATA:  Screening. History of cysts. EXAM: DIGITAL SCREENING BILATERAL MAMMOGRAM WITH TOMOSYNTHESIS AND CAD TECHNIQUE: Bilateral screening digital craniocaudal and mediolateral oblique mammograms were obtained. Bilateral screening digital breast tomosynthesis was performed. The images were evaluated with computer-aided detection. COMPARISON:  Previous exam(s). ACR Breast Density Category c: The breast tissue is heterogeneously dense, which may obscure small masses. FINDINGS: There are no findings suspicious for malignancy. There are multiple round and oval masses which have waxed and waned since prior exam. These are most consistent with benign cysts. IMPRESSION: No mammographic evidence of malignancy. A result letter of this screening mammogram will be mailed directly to the patient. RECOMMENDATION: Screening mammogram in one year. (Code:SM-B-01Y) BI-RADS CATEGORY  2: Benign. Electronically Signed   By: Meda Klinefelter MD   On: 10/05/2020 16:01  MS DIGITAL DIAG TOMO BILAT  Result Date: 06/30/2019 CLINICAL DATA:  52 year old female with 2 palpable areas of concern in the left breast. History of bilateral breast cysts. EXAM: DIGITAL DIAGNOSTIC BILATERAL MAMMOGRAM WITH CAD AND TOMO LEFT BREAST ULTRASOUND COMPARISON:  Previous exams. ACR Breast Density Category c: The breast tissue is heterogeneously dense,  which may obscure small masses. FINDINGS: No suspicious masses or calcifications are seen in the right breast. Spot compression tangential tomograms were performed over the palpable areas of concern in the left breast. There is an oval mass with obscured margins in the outer left breast in the region of palpable concern measuring approximately 2 cm. There is an additional oval mass with obscured margins at the additional site of palpable concern in the lower left breast measuring approximately 3.7 cm. Mammographic images were processed with CAD. Physical examination of the upper-outer left breast reveals a firm mobile mass. Physical examination the upper inner left breast also reveals a firm mobile mass. Targeted ultrasound of the left breast was performed demonstrating multiple cysts. A large cyst in the upper inner left breast at 11 o'clock 1 cm from nipple measures 3.9 x 1.5 x 3.2 cm. An additional cyst at 11 o'clock 2 cm from nipple measures 1.1 x 1.2 x 1.2 cm. A cyst in the upper outer left breast at site of palpable concern at 2 o'clock 4 cm from nipple measures 1.5 x 1 x 1.3 cm. These cysts correspond well with mammography findings and the palpable areas of concern. No suspicious masses or abnormality seen. IMPRESSION: Left breast cysts. There are no findings of malignancy in either breast. RECOMMENDATION: Screening mammogram in one year.(Code:SM-B-01Y) I have discussed the findings and recommendations with the patient. If applicable, a reminder letter will be sent to the patient regarding the next appointment. BI-RADS CATEGORY  2: Benign. Electronically Signed   By: Edwin Cap M.D.   On: 06/30/2019 11:20   MS DIGITAL DIAG TOMO BILAT  Result Date: 12/08/2017 CLINICAL DATA:  Palpable lump right breast. EXAM: DIGITAL DIAGNOSTIC BILATERAL MAMMOGRAM WITH CAD AND TOMO ULTRASOUND RIGHT BREAST COMPARISON:  Previous exam(s). ACR Breast Density Category d: The breast tissue is extremely dense, which lowers  the sensitivity of mammography. FINDINGS: Cc and MLO views of bilateral breasts, spot tangential view of right breast are submitted. The bilateral breast are stable. No suspicious abnormalities identified. Targeted ultrasound is performed, showing 1.3 x 1.2 x 1.2 cm simple cyst the right breast 10 o'clock 3 cm nipple palpable area. Mammographic images were processed with C AD. IMPRESSION: Benign findings. RECOMMENDATION: Routine screening mammogram in 1 year. I have discussed the findings and recommendations with the patient. Results were also provided in writing at the conclusion of the visit. If applicable, a reminder letter will be sent to the patient regarding the next appointment. BI-RADS CATEGORY  2: Benign. Electronically Signed   By: Sherian Rein M.D.   On: 12/08/2017 12:13       Pelvic/Bimanual Pap is not indicated today    Smoking History: Patient has never smoked and was not referred to quit line.    Patient Navigation: Patient education provided. Access to services provided for patient through [facility] program. Aliene Beams interpreter provided. No transportation provided   Colorectal Cancer Screening: Per patient has never had colonoscopy completed No complaints today. FIT test given.    Breast and Cervical Cancer Risk Assessment: Patient does not have family history of breast cancer, known genetic mutations, or radiation treatment to the chest before age 30. Patient does not have history of cervical dysplasia, immunocompromised, or DES exposure in-utero.  Risk Assessment   No risk assessment data for the current encounter  Risk Scores       10/04/2020   Last edited by: Scarlett Presto, RN   5-year risk: 0.8 %   Lifetime risk: 8.1 %            A: [facility] exam without pap smear No complaints with benign exam.   P: [facility]. Appointment scheduled 07/08/22.  Pascal Lux, NP 07/08/2022 9:54 AM

## 2022-07-08 NOTE — Patient Instructions (Signed)
Taught Holly Paul Staffbreast awareness and gave educational materials to take home. Patient did not need a Pap smear today due to last Pap smear was in 2024 per patient. Let her know [facility] will cover Pap smears every 5 years unless has a history of abnormal Pap smears. [facility]erred [facility] to [facility] for diagnostic mammogram. Appointment scheduled for 07/08/2022. Patient aware of appointment and will be there. Let patient know will follow up with her within the next couple weeks with results. Marvene Staff verbalized understanding.  Pascal Lux, NP 9:55 AM

## 2022-07-17 ENCOUNTER — Telehealth: Payer: Self-pay

## 2022-07-17 LAB — FECAL OCCULT BLOOD, IMMUNOCHEMICAL: Fecal Occult Bld: POSITIVE — AB

## 2022-07-17 NOTE — Telephone Encounter (Signed)
Using [address], [place], [place] [address], I have left a message for the pt requesting a call back to review her FIT result and to confirm if pt was menstruating at or around the time she collected her sample.

## 2022-07-24 ENCOUNTER — Telehealth: Payer: Self-pay

## 2022-07-24 NOTE — Progress Notes (Signed)
Spoke with patient's PCP @ [facility] in [place]. Referral has been put in by them for colonoscopy. Patient has been informed of the results and understands that her PCP will be following up with her.

## 2022-07-24 NOTE — Telephone Encounter (Signed)
Called patient to give FIT test results. Informed patient that FIT test was  positive for blood in stool . Confirmed that patient was not on her menstrual cycle at the time of her test. Patient goes to [facility] in [place]. Explained to patient that we will contact her PCP Sandrea Hughs for referral for colonoscopy. Patient voiced understanding.   Spoke with Dr. Rayetta Pigg nurse about lab result. She has put in the referral to get her scheduled for colonoscopy.

## 2023-08-13 ENCOUNTER — Telehealth: Payer: Self-pay | Admitting: *Deleted

## 2023-08-28 ENCOUNTER — Other Ambulatory Visit: Payer: Self-pay | Admitting: Primary Care

## 2023-08-28 DIAGNOSIS — Z1231 Encounter for screening mammogram for malignant neoplasm of breast: Secondary | ICD-10-CM

## 2023-09-11 ENCOUNTER — Ambulatory Visit
Admission: RE | Admit: 2023-09-11 | Discharge: 2023-09-11 | Disposition: A | Discharge status: Home / Self Care | Payer: Self-pay | Source: Ambulatory Visit | Attending: Primary Care | Admitting: Primary Care

## 2023-09-11 DIAGNOSIS — Z1231 Encounter for screening mammogram for malignant neoplasm of breast: Secondary | ICD-10-CM | POA: Insufficient documentation
